# Patient Record
Sex: Male | Born: 2000 | Race: White | Hispanic: Yes | Marital: Single | Smoking: Never smoker
Health system: Southern US, Community
[De-identification: ages and names within clinical notes are randomized; demographics above are authoritative.]

## PROBLEM LIST (undated history)

## (undated) DIAGNOSIS — L309 Dermatitis, unspecified: Secondary | ICD-10-CM

## (undated) HISTORY — PX: ARTHROSCOPIC REPAIR ACL: SUR80

---
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
## 2020-02-10 ENCOUNTER — Ambulatory Visit: Payer: Medicaid Other | Attending: Family

## 2020-02-10 DIAGNOSIS — Z23 Encounter for immunization: Secondary | ICD-10-CM

## 2020-03-15 NOTE — Progress Notes (Signed)
   Covid-19 Vaccination Clinic  Name:  RAINEN VANROSSUM    MRN: [id number] DOB: Jan 13, 2001  03/15/2020  Mr. Sortor was observed post Covid-19 immunization for 15 minutes without incident. He was provided with Vaccine Information Sheet and instruction to access the V-Safe system.   Mr. Gunkel was instructed to call 911 with any severe reactions post vaccine: Marland Kitchen Difficulty breathing  . Swelling of face and throat  . A fast heartbeat  . A bad rash all over body  . Dizziness and weakness   Immunizations Administered    Name Date Dose VIS Date Route   Pfizer COVID-19 Vaccine 02/10/2020  4:00 PM 0.3 mL 02/12/2020 Intramuscular   Manufacturer: [employer], [employer]   Lot: [id number]   NDC: [address]-9

## 2020-04-22 ENCOUNTER — Other Ambulatory Visit: Payer: Self-pay | Admitting: Orthopedic Surgery

## 2020-04-28 ENCOUNTER — Other Ambulatory Visit: Payer: Self-pay

## 2020-04-28 ENCOUNTER — Encounter (HOSPITAL_BASED_OUTPATIENT_CLINIC_OR_DEPARTMENT_OTHER): Payer: Self-pay | Admitting: Orthopedic Surgery

## 2020-04-28 NOTE — Progress Notes (Signed)
Pt seen 09/11/19 by Dr Jacques Navy for c/o chest pain. EKG SB, ECHO WNL EF 55-60%, Stress test ordered but not completed. Reviewed with Dr Richardson Landry. Ok to proceed with shoulder surgery as planned without further work up or clearance.

## 2020-05-01 ENCOUNTER — Other Ambulatory Visit (HOSPITAL_COMMUNITY): Payer: Medicaid Other

## 2020-05-04 ENCOUNTER — Encounter (HOSPITAL_COMMUNITY): Payer: Self-pay | Admitting: Anesthesiology

## 2020-05-04 ENCOUNTER — Other Ambulatory Visit (HOSPITAL_COMMUNITY)
Admission: RE | Admit: 2020-05-04 | Discharge: 2020-05-04 | Disposition: A | Discharge status: Home / Self Care | Payer: Medicaid Other | Source: Ambulatory Visit | Attending: Orthopedic Surgery | Admitting: Orthopedic Surgery

## 2020-05-04 DIAGNOSIS — Z01812 Encounter for preprocedural laboratory examination: Secondary | ICD-10-CM | POA: Insufficient documentation

## 2020-05-04 DIAGNOSIS — Z20822 Contact with and (suspected) exposure to covid-19: Secondary | ICD-10-CM | POA: Diagnosis not present

## 2020-05-04 DIAGNOSIS — Z8616 Personal history of COVID-19: Secondary | ICD-10-CM

## 2020-05-04 HISTORY — DX: Personal history of COVID-19: Z86.16

## 2020-05-04 LAB — SARS CORONAVIRUS 2 (TAT 6-24 HRS): SARS Coronavirus 2: POSITIVE — AB

## 2020-05-04 NOTE — Progress Notes (Signed)
      Enhanced Recovery after Surgery for Orthopedics Enhanced Recovery after Surgery is a protocol used to improve the stress on your body and your recovery after surgery.  Patient Instructions  . The night before surgery:  o No food after midnight. ONLY clear liquids after midnight  . The day of surgery (if you do NOT have diabetes):  o Drink ONE (1) Pre-Surgery Clear Ensure as directed.   o This drink was given to you during your hospital  pre-op appointment visit. o The pre-op nurse will instruct you on the time to drink the  Pre-Surgery Ensure depending on your surgery time. o Finish the drink at the designated time by the pre-op nurse.  o Nothing else to drink after completing the  Pre-Surgery Clear Ensure.  . The day of surgery (if you have diabetes): o Drink ONE (1) Gatorade 2 (G2) as directed. o This drink was given to you during your hospital  pre-op appointment visit.  o The pre-op nurse will instruct you on the time to drink the   Gatorade 2 (G2) depending on your surgery time. o Color of the Gatorade may vary. Red is not allowed. o Nothing else to drink after completing the  Gatorade 2 (G2).         If you have questions, please contact your surgeon's office.  Given surgical soap and instructions. Verbalized understanding. 

## 2020-05-04 NOTE — Progress Notes (Signed)
Text message sent to patient to remind him to go for covid testing before 3pm today. Address information for testing site and [facility] phone # provided.

## 2020-05-05 ENCOUNTER — Ambulatory Visit (HOSPITAL_BASED_OUTPATIENT_CLINIC_OR_DEPARTMENT_OTHER): Admission: RE | Admit: 2020-05-05 | Payer: Medicaid Other | Source: Home / Self Care | Admitting: Orthopedic Surgery

## 2020-05-05 SURGERY — ARTHROSCOPY, SHOULDER
Anesthesia: Choice | Site: Shoulder | Laterality: Left

## 2020-05-05 NOTE — Progress Notes (Signed)
Pt COVID + result. Dr. Ave Filter made aware and will notify patient of results and that surgery would need to be postponed.

## 2020-05-07 ENCOUNTER — Other Ambulatory Visit: Payer: Medicaid Other

## 2020-05-14 ENCOUNTER — Other Ambulatory Visit: Payer: Self-pay | Admitting: Orthopedic Surgery

## 2020-05-21 ENCOUNTER — Other Ambulatory Visit: Payer: Self-pay

## 2020-05-21 ENCOUNTER — Encounter (HOSPITAL_BASED_OUTPATIENT_CLINIC_OR_DEPARTMENT_OTHER): Payer: Self-pay | Admitting: Orthopedic Surgery

## 2020-05-22 ENCOUNTER — Other Ambulatory Visit (HOSPITAL_COMMUNITY): Payer: Medicaid Other

## 2020-05-25 NOTE — Anesthesia Preprocedure Evaluation (Signed)
Anesthesia Evaluation  Patient identified by MRN, date of birth, ID band Patient awake    Reviewed: Allergy & Precautions, H&P , NPO status , Patient's Chart, lab work & pertinent test results  Airway Mallampati: II  TM Distance: >3 FB Neck ROM: Full    Dental no notable dental hx. (+) Teeth Intact, Dental Advisory Given   Pulmonary neg pulmonary ROS,    Pulmonary exam normal breath sounds clear to auscultation       Cardiovascular Exercise Tolerance: Good negative cardio ROS Normal cardiovascular exam Rhythm:Regular Rate:Normal  ECHO 21' EF 55% NL   Neuro/Psych negative neurological ROS  negative psych ROS   GI/Hepatic negative GI ROS, Neg liver ROS,   Endo/Other  negative endocrine ROSMorbid obesity  Renal/GU negative Renal ROS  negative genitourinary   Musculoskeletal negative musculoskeletal ROS (+)   Abdominal   Peds negative pediatric ROS (+)  Hematology negative hematology ROS (+)   Anesthesia Other Findings   Reproductive/Obstetrics negative OB ROS                           Anesthesia Physical Anesthesia Plan  ASA: II  Anesthesia Plan: General   Post-op Pain Management: GA combined w/ Regional for post-op pain   Induction:   PONV Risk Score and Plan: 2 and Ondansetron, Dexamethasone and Midazolam  Airway Management Planned: Oral ETT and LMA  Additional Equipment:   Intra-op Plan:   Post-operative Plan: Extubation in OR  Informed Consent: I have reviewed the patients History and Physical, chart, labs and discussed the procedure including the risks, benefits and alternatives for the proposed anesthesia with the patient or authorized representative who has indicated his/her understanding and acceptance.     Dental advisory given  Plan Discussed with: Anesthesiologist and CRNA  Anesthesia Plan Comments: (Discussed both nerve block for pain relief post-op and GA;  including NV, sore throat, dental injury, and pulmonary complications)       Anesthesia Quick Evaluation

## 2020-05-26 ENCOUNTER — Encounter (HOSPITAL_BASED_OUTPATIENT_CLINIC_OR_DEPARTMENT_OTHER)
Admission: RE | Disposition: A | Discharge status: Home / Self Care | Payer: Self-pay | Source: Home / Self Care | Attending: Orthopedic Surgery

## 2020-05-26 ENCOUNTER — Ambulatory Visit (HOSPITAL_BASED_OUTPATIENT_CLINIC_OR_DEPARTMENT_OTHER)
Admission: RE | Admit: 2020-05-26 | Discharge: 2020-05-26 | Disposition: A | Discharge status: Home / Self Care | Payer: Medicaid Other | Attending: Orthopedic Surgery | Admitting: Orthopedic Surgery

## 2020-05-26 ENCOUNTER — Ambulatory Visit (HOSPITAL_BASED_OUTPATIENT_CLINIC_OR_DEPARTMENT_OTHER): Payer: Medicaid Other | Admitting: Anesthesiology

## 2020-05-26 ENCOUNTER — Other Ambulatory Visit: Payer: Self-pay

## 2020-05-26 ENCOUNTER — Encounter (HOSPITAL_BASED_OUTPATIENT_CLINIC_OR_DEPARTMENT_OTHER): Payer: Self-pay | Admitting: Orthopedic Surgery

## 2020-05-26 DIAGNOSIS — X58XXXA Exposure to other specified factors, initial encounter: Secondary | ICD-10-CM | POA: Diagnosis not present

## 2020-05-26 DIAGNOSIS — Z8616 Personal history of COVID-19: Secondary | ICD-10-CM | POA: Insufficient documentation

## 2020-05-26 DIAGNOSIS — S43432A Superior glenoid labrum lesion of left shoulder, initial encounter: Secondary | ICD-10-CM | POA: Insufficient documentation

## 2020-05-26 HISTORY — PX: SHOULDER ARTHROSCOPY WITH LABRAL REPAIR: SHX5691

## 2020-05-26 SURGERY — ARTHROSCOPY, SHOULDER, WITH GLENOID LABRUM REPAIR
Anesthesia: General | Site: Shoulder | Laterality: Left

## 2020-05-26 MED ORDER — ROCURONIUM BROMIDE 10 MG/ML (PF) SYRINGE
PREFILLED_SYRINGE | INTRAVENOUS | Status: AC
  Filled 2020-05-26: qty 10

## 2020-05-26 MED ORDER — ACETAMINOPHEN 325 MG PO TABS: 325.0000 mg | ORAL_TABLET | ORAL | Status: DC | PRN

## 2020-05-26 MED ORDER — MIDAZOLAM HCL 2 MG/2ML IJ SOLN
2.0000 mg | Freq: Once | INTRAMUSCULAR | Status: AC
  Administered 2020-05-26: 2 mg via INTRAVENOUS

## 2020-05-26 MED ORDER — TIZANIDINE HCL 4 MG PO TABS: 4.0000 mg | ORAL_TABLET | Freq: Three times a day (TID) | ORAL | 1 refills | Status: AC | PRN

## 2020-05-26 MED ORDER — FENTANYL CITRATE (PF) 100 MCG/2ML IJ SOLN
100.0000 ug | Freq: Once | INTRAMUSCULAR | Status: AC
  Administered 2020-05-26: 100 ug via INTRAVENOUS

## 2020-05-26 MED ORDER — MEPERIDINE HCL 25 MG/ML IJ SOLN
6.2500 mg | INTRAMUSCULAR | Status: DC | PRN
Start: 2020-05-26 — End: 2020-05-26

## 2020-05-26 MED ORDER — HYDROCODONE-ACETAMINOPHEN 5-325 MG PO TABS: 1.0000 | ORAL_TABLET | ORAL | 0 refills | Status: AC | PRN

## 2020-05-26 MED ORDER — SUGAMMADEX SODIUM 500 MG/5ML IV SOLN
INTRAVENOUS | Status: AC
  Filled 2020-05-26: qty 5

## 2020-05-26 MED ORDER — ONDANSETRON HCL 4 MG/2ML IJ SOLN: 4.0000 mg | Freq: Once | INTRAMUSCULAR | Status: DC | PRN

## 2020-05-26 MED ORDER — CEFAZOLIN SODIUM-DEXTROSE 2-4 GM/100ML-% IV SOLN
INTRAVENOUS | Status: AC
  Filled 2020-05-26: qty 100

## 2020-05-26 MED ORDER — MIDAZOLAM HCL 2 MG/2ML IJ SOLN
INTRAMUSCULAR | Status: AC
  Filled 2020-05-26: qty 2

## 2020-05-26 MED ORDER — DEXMEDETOMIDINE (PRECEDEX) IN NS 20 MCG/5ML (4 MCG/ML) IV SYRINGE
PREFILLED_SYRINGE | INTRAVENOUS | Status: AC
  Filled 2020-05-26: qty 5

## 2020-05-26 MED ORDER — LACTATED RINGERS IV SOLN: INTRAVENOUS | Status: DC

## 2020-05-26 MED ORDER — SODIUM CHLORIDE 0.9 % IR SOLN
Status: DC | PRN
  Administered 2020-05-26: 3000 mL

## 2020-05-26 MED ORDER — ROCURONIUM BROMIDE 100 MG/10ML IV SOLN
INTRAVENOUS | Status: DC | PRN
  Administered 2020-05-26: 70 mg via INTRAVENOUS

## 2020-05-26 MED ORDER — FENTANYL CITRATE (PF) 100 MCG/2ML IJ SOLN
INTRAMUSCULAR | Status: DC | PRN
  Administered 2020-05-26 (×2): 50 ug via INTRAVENOUS

## 2020-05-26 MED ORDER — EPINEPHRINE PF 1 MG/ML IJ SOLN
INTRAMUSCULAR | Status: AC
  Filled 2020-05-26: qty 4

## 2020-05-26 MED ORDER — BUPIVACAINE LIPOSOME 1.3 % IJ SUSP
INTRAMUSCULAR | Status: DC | PRN
Start: 2020-05-26 — End: 2020-05-26
  Administered 2020-05-26: 10 mL via PERINEURAL

## 2020-05-26 MED ORDER — DEXAMETHASONE SODIUM PHOSPHATE 10 MG/ML IJ SOLN
INTRAMUSCULAR | Status: DC | PRN
  Administered 2020-05-26: 5 mg via INTRAVENOUS

## 2020-05-26 MED ORDER — FENTANYL CITRATE (PF) 100 MCG/2ML IJ SOLN: 25.0000 ug | INTRAMUSCULAR | Status: DC | PRN

## 2020-05-26 MED ORDER — SUGAMMADEX SODIUM 500 MG/5ML IV SOLN
INTRAVENOUS | Status: DC | PRN
  Administered 2020-05-26: 450 mg via INTRAVENOUS

## 2020-05-26 MED ORDER — LIDOCAINE 2% (20 MG/ML) 5 ML SYRINGE
INTRAMUSCULAR | Status: AC
  Filled 2020-05-26: qty 5

## 2020-05-26 MED ORDER — DEXMEDETOMIDINE (PRECEDEX) IN NS 20 MCG/5ML (4 MCG/ML) IV SYRINGE
PREFILLED_SYRINGE | INTRAVENOUS | Status: DC | PRN
  Administered 2020-05-26 (×4): 4 ug via INTRAVENOUS

## 2020-05-26 MED ORDER — BUPIVACAINE HCL 0.5 % IJ SOLN
INTRAMUSCULAR | Status: DC | PRN
  Administered 2020-05-26: 15 mL

## 2020-05-26 MED ORDER — FENTANYL CITRATE (PF) 100 MCG/2ML IJ SOLN
INTRAMUSCULAR | Status: AC
  Filled 2020-05-26: qty 2

## 2020-05-26 MED ORDER — OXYCODONE HCL 5 MG PO TABS
5.0000 mg | ORAL_TABLET | Freq: Once | ORAL | Status: DC | PRN
Start: 2020-05-26 — End: 2020-05-26

## 2020-05-26 MED ORDER — ACETAMINOPHEN 160 MG/5ML PO SOLN: 325.0000 mg | ORAL | Status: DC | PRN

## 2020-05-26 MED ORDER — CEFAZOLIN SODIUM-DEXTROSE 2-4 GM/100ML-% IV SOLN
2.0000 g | INTRAVENOUS | Status: AC
  Administered 2020-05-26: 2 g via INTRAVENOUS

## 2020-05-26 MED ORDER — ONDANSETRON HCL 4 MG/2ML IJ SOLN
INTRAMUSCULAR | Status: DC | PRN
  Administered 2020-05-26: 4 mg via INTRAVENOUS

## 2020-05-26 MED ORDER — DEXAMETHASONE SODIUM PHOSPHATE 10 MG/ML IJ SOLN
INTRAMUSCULAR | Status: AC
  Filled 2020-05-26: qty 1

## 2020-05-26 MED ORDER — PROPOFOL 10 MG/ML IV BOLUS
INTRAVENOUS | Status: DC | PRN
  Administered 2020-05-26: 200 mg via INTRAVENOUS

## 2020-05-26 MED ORDER — OXYCODONE HCL 5 MG/5ML PO SOLN: 5.0000 mg | Freq: Once | ORAL | Status: DC | PRN

## 2020-05-26 MED ORDER — PROPOFOL 10 MG/ML IV BOLUS
INTRAVENOUS | Status: AC
  Filled 2020-05-26: qty 40

## 2020-05-26 MED ORDER — ONDANSETRON HCL 4 MG/2ML IJ SOLN
INTRAMUSCULAR | Status: AC
  Filled 2020-05-26: qty 2

## 2020-05-26 SURGICAL SUPPLY — 59 items
ANCH SUT SHRT 12.5 CANN EYLT (Anchor) ×2 IMPLANT
ANCHOR SUT BIOCOMP LK 2.9X12.5 (Anchor) ×4 IMPLANT
APL PRP STRL LF DISP 70% ISPRP (MISCELLANEOUS) ×1
APL SKNCLS STERI-STRIP NONHPOA (GAUZE/BANDAGES/DRESSINGS)
BENZOIN TINCTURE PRP APPL 2/3 (GAUZE/BANDAGES/DRESSINGS) IMPLANT
BLADE CLIPPER SURG (BLADE) IMPLANT
BURR OVAL 8 FLU 4.0X13 (MISCELLANEOUS) IMPLANT
CANNULA 5.75X7 CRYSTAL CLEAR (CANNULA) ×2 IMPLANT
CANNULA TWIST IN 8.25X7CM (CANNULA) IMPLANT
CHLORAPREP W/TINT 26 (MISCELLANEOUS) ×2 IMPLANT
COVER WAND RF STERILE (DRAPES) IMPLANT
CUTTER BONE 4.0MM X 13CM (MISCELLANEOUS) ×2 IMPLANT
DECANTER SPIKE VIAL GLASS SM (MISCELLANEOUS) IMPLANT
DRAPE IMP U-DRAPE 54X76 (DRAPES) ×2 IMPLANT
DRAPE INCISE IOBAN 66X45 STRL (DRAPES) ×2 IMPLANT
DRAPE STERI 35X30 U-POUCH (DRAPES) ×2 IMPLANT
DRAPE SURG 17X23 STRL (DRAPES) ×2 IMPLANT
DRAPE U-SHAPE 47X51 STRL (DRAPES) ×2 IMPLANT
DRAPE U-SHAPE 76X120 STRL (DRAPES) ×4 IMPLANT
DRSG PAD ABDOMINAL 8X10 ST (GAUZE/BANDAGES/DRESSINGS) ×2 IMPLANT
FIBERSTICK 2 (SUTURE) IMPLANT
GAUZE 4X4 16PLY RFD (DISPOSABLE) IMPLANT
GAUZE SPONGE 4X4 12PLY STRL (GAUZE/BANDAGES/DRESSINGS) ×2 IMPLANT
GAUZE XEROFORM 1X8 LF (GAUZE/BANDAGES/DRESSINGS) ×2 IMPLANT
GLOVE BIO SURGEON STRL SZ7.5 (GLOVE) ×2 IMPLANT
GLOVE ECLIPSE 6.5 STRL STRAW (GLOVE) ×2 IMPLANT
GLOVE SRG 8 PF TXTR STRL LF DI (GLOVE) ×1 IMPLANT
GLOVE SURG ENC MOIS LTX SZ7 (GLOVE) ×2 IMPLANT
GLOVE SURG UNDER POLY LF SZ7 (GLOVE) ×4 IMPLANT
GLOVE SURG UNDER POLY LF SZ8 (GLOVE) ×2
GOWN STRL REUS W/ TWL LRG LVL3 (GOWN DISPOSABLE) ×2 IMPLANT
GOWN STRL REUS W/TWL LRG LVL3 (GOWN DISPOSABLE) ×6 IMPLANT
KIT PERC INSERT 3.0 KNTLS (KITS) ×2 IMPLANT
KIT PUSHLOCK 2.9 HIP (KITS) ×2 IMPLANT
LASSO 90 CVE QUICKPAS (DISPOSABLE) ×2 IMPLANT
LASSO CRESCENT QUICKPASS (SUTURE) IMPLANT
MANIFOLD NEPTUNE II (INSTRUMENTS) ×2 IMPLANT
NS IRRIG 1000ML POUR BTL (IV SOLUTION) IMPLANT
PACK ARTHROSCOPY DSU (CUSTOM PROCEDURE TRAY) ×2 IMPLANT
PACK BASIN DAY SURGERY FS (CUSTOM PROCEDURE TRAY) ×2 IMPLANT
PROBE BIPOLAR ATHRO 135MM 90D (MISCELLANEOUS) ×2 IMPLANT
SHEET MEDIUM DRAPE 40X70 STRL (DRAPES) IMPLANT
SLEEVE SCD COMPRESS KNEE MED (MISCELLANEOUS) ×2 IMPLANT
SLING ARM FOAM STRAP LRG (SOFTGOODS) IMPLANT
SLING ARM FOAM STRAP XLG (SOFTGOODS) ×2 IMPLANT
SUPPORT WRAP ARM LG (MISCELLANEOUS) ×2 IMPLANT
SUT ETHILON 4 0 PS 2 18 (SUTURE) ×2 IMPLANT
SUT FIBERWIRE #2 38 T-5 BLUE (SUTURE)
SUT FIBERWIRE 2-0 18 17.9 3/8 (SUTURE)
SUT PDS AB 0 CT 36 (SUTURE) IMPLANT
SUT TIGER TAPE 7 IN WHITE (SUTURE) IMPLANT
SUTURE FIBERWR #2 38 T-5 BLUE (SUTURE) IMPLANT
SUTURE FIBERWR 2-0 18 17.9 3/8 (SUTURE) IMPLANT
TAPE FIBER 2MM 7IN #2 BLUE (SUTURE) IMPLANT
TAPE LABRALWHITE 1.5X36 (TAPE) ×2 IMPLANT
TAPE SUT LABRALTAP WHT/BLK (SUTURE) ×2 IMPLANT
TOWEL GREEN STERILE FF (TOWEL DISPOSABLE) ×2 IMPLANT
TUBE CONNECTING 20X1/4 (TUBING) ×2 IMPLANT
TUBING ARTHROSCOPY IRRIG 16FT (MISCELLANEOUS) ×2 IMPLANT

## 2020-05-26 NOTE — Anesthesia Procedure Notes (Signed)
Anesthesia Regional Block: Interscalene brachial plexus block   Pre-Anesthetic Checklist: ,, timeout performed, Correct Patient, Correct Site, Correct Laterality, Correct Procedure, Correct Position, site marked, Risks and benefits discussed,  Surgical consent,  Pre-op evaluation,  At surgeon's request and post-op pain management  Laterality: Left  Prep: chloraprep       Needles:  Injection technique: Single-shot  Needle Type: Echogenic Stimulator Needle     Needle Length: 5cm  Needle Gauge: 22     Additional Needles:   Procedures:, nerve stimulator,,, ultrasound used (permanent image in chart),,,,   Nerve Stimulator or Paresthesia:  Response: hand, 0.45 mA,   Additional Responses:   Narrative:  Start time: 05/26/2020 7:15 AM End time: 05/26/2020 7:20 AM Injection made incrementally with aspirations every 5 mL.  Performed by: Personally  Anesthesiologist: Bethena Midget, MD  Additional Notes: Functioning IV was confirmed and monitors were applied.  A 66mm 22ga Arrow echogenic stimulator needle was used. Sterile prep and drape,hand hygiene and sterile gloves were used. Ultrasound guidance: relevant anatomy identified, needle position confirmed, local anesthetic spread visualized around nerve(s)., vascular puncture avoided.  Image printed for medical record. Negative aspiration and negative test dose prior to incremental administration of local anesthetic. The patient tolerated the procedure well.

## 2020-05-26 NOTE — Anesthesia Postprocedure Evaluation (Signed)
Anesthesia Post Note  Patient: Cody Wright  Procedure(s) Performed: ARTHROSCOPY SHOULDER SUPERIOR LABRUM ANTERIOR TO POSTERIOR REPAIR (Left Shoulder)     Patient location during evaluation: PACU Anesthesia Type: General Level of consciousness: awake and alert Pain management: pain level controlled Vital Signs Assessment: post-procedure vital signs reviewed and stable Respiratory status: spontaneous breathing, nonlabored ventilation, respiratory function stable and patient connected to nasal cannula oxygen Cardiovascular status: blood pressure returned to baseline and stable Postop Assessment: no apparent nausea or vomiting Anesthetic complications: no   No complications documented.  Last Vitals:  Vitals:   05/26/20 0911 05/26/20 0946  BP:  132/63  Pulse: 75 72  Resp: 19 20  Temp:  37 C  SpO2: 95% 96%    Last Pain:  Vitals:   05/26/20 0946  TempSrc: Oral  PainSc: 0-No pain                 Wilmore Holsomback

## 2020-05-26 NOTE — Transfer of Care (Signed)
Immediate Anesthesia Transfer of Care Note  Patient: Cody Wright  Procedure(s) Performed: ARTHROSCOPY SHOULDER SUPERIOR LABRUM ANTERIOR TO POSTERIOR REPAIR (Left Shoulder)  Patient Location: PACU  Anesthesia Type:General and Regional  Level of Consciousness: drowsy  Airway & Oxygen Therapy: Patient Spontanous Breathing and Patient connected to face mask oxygen  Post-op Assessment: Report given to RN and Post -op Vital signs reviewed and stable  Post vital signs: Reviewed and stable  Last Vitals:  Vitals Value Taken Time  BP 131/69 05/26/20 0840  Temp    Pulse 69 05/26/20 0842  Resp 15 05/26/20 0842  SpO2 99 % 05/26/20 0842  Vitals shown include unvalidated device data.  Last Pain:  Vitals:   05/26/20 0648  TempSrc: Oral  PainSc: 4       Patients Stated Pain Goal: 3 (05/26/20 3846)  Complications: No complications documented.

## 2020-05-26 NOTE — H&P (Signed)
Cody Wright is an 20 y.o. male.   Chief Complaint: L shoulder pain  HPI: L shoulder SLAP tear , failed conservative treatment.  Past Medical History:  Diagnosis Date  . Asthma    no problems since age 87  . Eczema   . History of COVID-19 05/04/2020    Past Surgical History:  Procedure Laterality Date  . ARTHROSCOPIC REPAIR ACL Right     History reviewed. No pertinent family history. Social History:  reports that he has never smoked. He has never used smokeless tobacco. He reports that he does not drink alcohol and does not use drugs.  Allergies:  Allergies  Allergen Reactions  . Mineral Oil Rash    Medications Prior to Admission  Medication Sig Dispense Refill  . ibuprofen (ADVIL,MOTRIN) 200 MG tablet Take 200 mg by mouth once.      No results found for this or any previous visit (from the past 48 hour(s)). No results found.  Review of Systems  All other systems reviewed and are negative.   Blood pressure 131/71, pulse 68, temperature 98.3 F (36.8 C), temperature source Oral, resp. rate 20, height 5\' 10"  (1.778 m), weight 115.4 kg, SpO2 99 %. Physical Exam Constitutional:      Appearance: He is well-developed and well-nourished.  HENT:     Head: Atraumatic.  Eyes:     Extraocular Movements: EOM normal.  Cardiovascular:     Pulses: Intact distal pulses.  Pulmonary:     Effort: Pulmonary effort is normal.  Musculoskeletal:     Comments: Left shoulder pain with obriens testing.  Skin:    General: Skin is warm and dry.  Neurological:     Mental Status: He is alert and oriented to person, place, and time.  Psychiatric:        Mood and Affect: Mood and affect normal.      Assessment/Plan  L shoulder SLAP tear , failed conservative treatment. Plan arth SLAP repair Risks / benefits of surgery discussed Consent on chart  NPO for OR Preop antibiotics  , MD 05/26/2020, 7:10 AM

## 2020-05-26 NOTE — Anesthesia Procedure Notes (Signed)
Procedure Name: Intubation Date/Time: 05/26/2020 7:35 AM Performed by: Marny Lowenstein, CRNA Pre-anesthesia Checklist: Patient identified, Emergency Drugs available, Suction available and Patient being monitored Patient Re-evaluated:Patient Re-evaluated prior to induction Oxygen Delivery Method: Circle system utilized Preoxygenation: Pre-oxygenation with 100% oxygen Induction Type: IV induction Ventilation: Mask ventilation without difficulty Laryngoscope Size: Miller and 2 Grade View: Grade I Tube type: Oral Tube size: 7.5 mm Number of attempts: 1 Airway Equipment and Method: Patient positioned with wedge pillow Placement Confirmation: ETT inserted through vocal cords under direct vision,  positive ETCO2 and breath sounds checked- equal and bilateral Secured at: 23 cm Tube secured with: Tape Dental Injury: Teeth and Oropharynx as per pre-operative assessment

## 2020-05-26 NOTE — Op Note (Signed)
Procedure(s): ARTHROSCOPY SHOULDER SUPERIOR LABRUM ANTERIOR TO POSTERIOR REPAIR Procedure Note  Cody Wright male 20 y.o. 05/26/2020   Preop diagnosis: Left shoulder SLAP tear Postoperative diagnosis: Same  Procedure(s) and Anesthesia Type:    * ARTHROSCOPY SHOULDER SUPERIOR LABRUM ANTERIOR TO POSTERIOR REPAIR - General  Surgeon(s) and Role:    Jones Broom, MD - Primary     Surgeon: [name]   Assistants: [name] PA-C [facility] was present and scrubbed throughout the procedure and was essential in positioning, assisting with the camera and instrumentation,, and closure)  Anesthesia: General LMA anesthesia with preoperative interscalene block given by the attending anesthesiologist    Procedure Detail  ARTHROSCOPY SHOULDER SUPERIOR LABRUM ANTERIOR TO POSTERIOR REPAIR  Estimated Blood Loss: Min         Drains: none  Blood Given: none         Specimens: none        Complications:  * No complications entered in OR log *         Disposition: PACU - hemodynamically stable.         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  He has had left shoulder pain which has been refractory to nonoperative management.  MRI revealed superior anterior labral tear, ultimately indicated for surgery to try and decrease pain and restore function.  OPERATIVE FINDINGS: Examination under anesthesia: No stiffness or instability  DESCRIPTION OF PROCEDURE: The patient was identified in preoperative  holding area where I personally marked the operative site after  verifying site, side, and procedure with the patient. An interscalene block was given by the attending anesthesiologist the holding area.  The patient was taken back to the operating room where general anesthesia was induced without complication and was placed in the beach-chair position with the back  elevated about 60  degrees and all extremities and head and neck carefully padded and  positioned.   The left upper extremity was then prepped and  draped in a standard sterile fashion. The appropriate time-out  procedure was carried out. The patient did receive IV antibiotics  within 30 minutes of incision.   A small posterior portal incision was made and the arthroscope was introduced into the joint. An anterior portal was then established above the subscapularis using needle localization. Small cannula was placed anteriorly. Diagnostic arthroscopy was then carried out. The subscapularis was intact.  The biceps tendon was healthy-appearing.  There was a little bit of fraying around the biceps pulley but overall the pulley was intact.  Glenohumeral joint surfaces were intact without chondromalacia.  The undersurface of the supraspinatus and infraspinatus were intact without any partial tearing.  The superior labrum was noted to have extensive tearing with complete detachment of the biceps anchor.  This extended down slightly into the anterior superior labrum with flap tears but no significant detachment and the anterior inferior labrum was completely intact.  The posterior labrum was also intact.  The shaver was used to debride the undersurface of the tear and the superior aspect of the glenoid attachment site was debrided down to a bleeding bony surface to promote healing.  A large cannula was used anteriorly and a percutaneous small cannula was placed through the musculotendinous junction of the supraspinatus through a lateral portal of [place].  Labral tapes were then passed around the superior labrum just posterior to the biceps origin and a second about  8 to 10 mm behind the first.  These were placed into 2.9 push lock anchors in the superior labrum completely repairing the torn aspect of the superior labrum.  The anterior labral flap tears were debrided back to healthy labrum.  The arthroscopic equipment was  removed from the joint and the portals were closed with 3-0 nylon in an interrupted fashion. Sterile dressings were then applied including Xeroform 4 x 4's ABDs and tape. The patient was then allowed to awaken from general anesthesia, placed in a sling, transferred to the stretcher and taken to the recovery room in stable condition.   POSTOPERATIVE PLAN: The patient will be discharged home today and will followup in one week for suture removal and wound check.

## 2020-05-26 NOTE — Discharge Instructions (Signed)
Post Anesthesia Home Care Instructions  Activity: Get plenty of rest for the remainder of the day. A responsible individual must stay with you for 24 hours following the procedure.  For the next 24 hours, DO NOT: -Drive a car -Paediatric nurse -Drink alcoholic beverages -Take any medication unless instructed by your physician -Make any legal decisions or sign important papers.  Meals: Start with liquid foods such as gelatin or soup. Progress to regular foods as tolerated. Avoid greasy, spicy, heavy foods. If nausea and/or vomiting occur, drink only clear liquids until the nausea and/or vomiting subsides. Call your physician if vomiting continues.  Special Instructions/Symptoms: Your throat may feel dry or sore from the anesthesia or the breathing tube placed in your throat during surgery. If this causes discomfort, gargle with warm salt water. The discomfort should disappear within 24 hours.  If you had a scopolamine patch placed behind your ear for the management of post- operative nausea and/or vomiting:  1. The medication in the patch is effective for 72 hours, after which it should be removed.  Wrap patch in a tissue and discard in the trash. Wash hands thoroughly with soap and water. 2. You may remove the patch earlier than 72 hours if you experience unpleasant side effects which may include dry mouth, dizziness or visual disturbances. 3. Avoid touching the patch. Wash your hands with soap and water after contact with the patch.       Regional Anesthesia Blocks  1. Numbness or the inability to move the "blocked" extremity may last from 3-48 hours after placement. The length of time depends on the medication injected and your individual response to the medication. If the numbness is not going away after 48 hours, call your surgeon.  2. The extremity that is blocked will need to be protected until the numbness is gone and the  Strength has returned. Because you cannot feel it, you  will need to take extra care to avoid injury. Because it may be weak, you may have difficulty moving it or using it. You may not know what position it is in without looking at it while the block is in effect.  3. For blocks in the legs and feet, returning to weight bearing and walking needs to be done carefully. You will need to wait until the numbness is entirely gone and the strength has returned. You should be able to move your leg and foot normally before you try and bear weight or walk. You will need someone to be with you when you first try to ensure you do not fall and possibly risk injury.  4. Bruising and tenderness at the needle site are common side effects and will resolve in a few days.  5. Persistent numbness or new problems with movement should be communicated to the surgeon or the [place] [phone] [place] [phone]).   Information for Discharge Teaching: EXPAREL (bupivacaine liposome injectable suspension)   Your surgeon or anesthesiologist gave you EXPAREL(bupivacaine) to help control your pain after surgery.   EXPAREL is a local anesthetic that provides pain relief by numbing the tissue around the surgical site.  EXPAREL is designed to release pain medication over time and can control pain for up to 72 hours.  Depending on how you respond to EXPAREL, you may require less pain medication during your recovery.  Possible side effects:  Temporary loss of sensation or ability to move in the area where bupivacaine was injected.  Nausea, vomiting, constipation  Rarely, numbness and tingling in your mouth or lips, lightheadedness, or anxiety may occur.  Call your doctor right away if you think you may be experiencing any of these sensations, or if you have other questions regarding possible side effects.  Follow all other discharge instructions given to you by your surgeon or nurse. Eat a healthy diet and drink plenty of water or  other fluids.  If you return to the hospital for any reason within 96 hours following the administration of EXPAREL, it is important for health care providers to know that you have received this anesthetic. A teal colored band has been placed on your arm with the date, time and amount of EXPAREL you have received in order to alert and inform your health care providers. Please leave this armband in place for the full 96 hours following administration, and then you may remove the band.    Discharge Instructions after Arthroscopic Shoulder Repair   A sling has been provided for you. Remain in your sling at all times. This includes sleeping in your sling.  Use ice on the shoulder intermittently over the first 48 hours after surgery.  Pain medicine has been prescribed for you.  Use your medicine liberally over the first 48 hours, and then you can begin to taper your use. You may take Extra Strength Tylenol or Tylenol only in place of the pain pills. DO NOT take ANY nonsteroidal anti-inflammatory pain medications: Advil, Motrin, Ibuprofen, Aleve, Naproxen, or Narprosyn.  You may remove your dressing after two days. If the incision sites are still moist, place a Band-Aid over the moist site(s). Change Band-Aids daily until dry.  You may shower 5 days after surgery. The incisions CANNOT get wet prior to 5 days. Simply allow the water to wash over the site and then pat dry. Do not rub the incisions. Make sure your axilla (armpit) is completely dry after showering.  Take one aspirin a day for 2 weeks after surgery, unless you have an aspirin sensitivity/ allergy or asthma.   Please call [phone] during normal business hours or [phone] after hours for any problems. Including the following:  - excessive redness of the incisions - drainage for more than 4 days - fever of more than 101.5 F  *Please note that pain medications will not be refilled after hours or on weekends.

## 2020-05-26 NOTE — Progress Notes (Signed)
Assisted Dr. Oddono with left, ultrasound guided, interscalene  block. Side rails up, monitors on throughout procedure. See vital signs in flow sheet. Tolerated Procedure well. 

## 2020-05-27 ENCOUNTER — Encounter (HOSPITAL_BASED_OUTPATIENT_CLINIC_OR_DEPARTMENT_OTHER): Payer: Self-pay | Admitting: Orthopedic Surgery

## 2022-07-25 ENCOUNTER — Other Ambulatory Visit: Payer: Self-pay

## 2022-07-25 ENCOUNTER — Emergency Department (HOSPITAL_BASED_OUTPATIENT_CLINIC_OR_DEPARTMENT_OTHER): Payer: Medicaid Other

## 2022-07-25 ENCOUNTER — Encounter (HOSPITAL_BASED_OUTPATIENT_CLINIC_OR_DEPARTMENT_OTHER): Payer: Self-pay

## 2022-07-25 ENCOUNTER — Emergency Department (HOSPITAL_BASED_OUTPATIENT_CLINIC_OR_DEPARTMENT_OTHER)
Admission: EM | Admit: 2022-07-25 | Discharge: 2022-07-25 | Disposition: A | Discharge status: Home / Self Care | Payer: Medicaid Other | Attending: Emergency Medicine | Admitting: Emergency Medicine

## 2022-07-25 DIAGNOSIS — S022XXA Fracture of nasal bones, initial encounter for closed fracture: Secondary | ICD-10-CM | POA: Diagnosis not present

## 2022-07-25 DIAGNOSIS — W2103XA Struck by baseball, initial encounter: Secondary | ICD-10-CM | POA: Insufficient documentation

## 2022-07-25 DIAGNOSIS — Y9364 Activity, baseball: Secondary | ICD-10-CM | POA: Diagnosis not present

## 2022-07-25 DIAGNOSIS — R040 Epistaxis: Secondary | ICD-10-CM | POA: Diagnosis present

## 2022-07-25 MED ORDER — KETOROLAC TROMETHAMINE 30 MG/ML IJ SOLN
30.0000 mg | Freq: Once | INTRAMUSCULAR | Status: AC
  Administered 2022-07-25: 30 mg via INTRAMUSCULAR
  Filled 2022-07-25: qty 1

## 2022-07-25 NOTE — ED Provider Notes (Signed)
[facility] Provider Note   CSN: [id number] Arrival date & time: 07/25/22  1828     History  Chief Complaint  Patient presents with   Facial Injury    Cody Wright is a 22 y.o. male.  Who presents to the ED for evaluation of facial trauma.  He was playing baseball when he dove for a ground ball.  The ball bounced, hit his glove and then hit him in the nose.  He did not lose consciousness.  Did not vomit after the incident.  Does not take blood thinners.  Reported that he had epistaxis immediately afterwards but lasted only few seconds and then resolved on its own.  Reports a mild headache and TTP of the nose.  Denies vision changes, numbness, weakness, tingling, neck pain.  Denies seizure-like activity or vomiting   Facial Injury      Home Medications Prior to Admission medications   Medication Sig Start Date End Date Taking? Authorizing Provider  tiZANidine (ZANAFLEX) 4 MG tablet Take 1 tablet (4 mg total) by mouth every 8 (eight) hours as needed for muscle spasms. 05/26/20   Grier Mitts, PA-C      Allergies    Mineral oil    Review of Systems   Review of Systems  Musculoskeletal:  Positive for joint swelling and myalgias.  All other systems reviewed and are negative.   Physical Exam Updated Vital Signs BP 136/74 (BP Location: Right Arm)   Pulse 77   Temp 99 F (37.2 C) (Oral)   Resp 18   Ht 5\' 10"  (1.778 m)   Wt 90.7 kg   SpO2 100%   BMI 28.70 kg/m  Physical Exam Vitals and nursing note reviewed.  Constitutional:      General: He is not in acute distress.    Appearance: Normal appearance. He is normal weight. He is not ill-appearing.  HENT:     Head: Normocephalic and atraumatic.     Comments: No raccoon eyes or Battle sign    Right Ear: Tympanic membrane normal.     Left Ear: Tympanic membrane normal.     Ears:     Comments: No hemotympanums    Nose:     Comments: Swelling to the bridge of the  nose.  No active bleeding.  Septum midline.    Mouth/Throat:     Mouth: Mucous membranes are moist.     Pharynx: No oropharyngeal exudate or posterior oropharyngeal erythema.     Comments: No bleeding Eyes:     Extraocular Movements: Extraocular movements intact.     Pupils: Pupils are equal, round, and reactive to light.  Pulmonary:     Effort: Pulmonary effort is normal. No respiratory distress.  Abdominal:     General: Abdomen is flat.  Musculoskeletal:        General: Normal range of motion.     Cervical back: Normal range of motion and neck supple. No rigidity or tenderness.     Comments: Swelling and TTP to the bridge of the nose  Skin:    General: Skin is warm and dry.  Neurological:     General: No focal deficit present.     Mental Status: He is alert and oriented to person, place, and time.  Psychiatric:        Mood and Affect: Mood normal.        Behavior: Behavior normal.     ED Results / Procedures / Treatments  Labs (all labs ordered are listed, but only abnormal results are displayed) Labs Reviewed - No data to display  EKG None  Radiology CT Maxillofacial Wo Contrast  Result Date: 07/25/2022 CLINICAL DATA:  Nose deformity EXAM: CT MAXILLOFACIAL WITHOUT CONTRAST TECHNIQUE: Multidetector CT imaging of the maxillofacial structures was performed. Multiplanar CT image reconstructions were also generated. RADIATION DOSE REDUCTION: This exam was performed according to the departmental dose-optimization program which includes automated exposure control, adjustment of the mA and/or kV according to patient size and/or use of iterative reconstruction technique. COMPARISON:  None Available. FINDINGS: Osseous: Mastoid air cells are clear. Mandibular heads are normally position. No mandibular fracture. Pterygoid plates and zygomatic arches are intact. Acute mildly displaced bilateral nasal bone fracture. Mild bowing of nasal septum to the right. Orbits: Negative. No traumatic  or inflammatory finding. Sinuses: No sinus wall fracture or fluid level Soft tissues: Swelling over the low forehead and nasal area. Limited intracranial: No significant or unexpected finding. IMPRESSION: Acute mildly displaced bilateral nasal bone fracture. Electronically Signed   By: Donavan Foil M.D.   On: 07/25/2022 19:57    Procedures Procedures    Medications Ordered in ED Medications  ketorolac (TORADOL) 30 MG/ML injection 30 mg (has no administration in time range)    ED Course/ Medical Decision Making/ A&P                             Medical Decision Making Amount and/or Complexity of Data Reviewed Radiology: ordered.  This patient presents to the ED for concern of facial trauma, this involves an extensive number of treatment options. The differential diagnosis includes fracture, contusion  My initial workup includes pain control, CT maxillofacial  Additional history obtained from: Nursing notes from this visit.  I ordered imaging studies including CT maxillofacial I independently visualized and interpreted imaging which showed minimally displaced bilateral nasal bone fractures I agree with the radiologist interpretation  Afebrile, hemodynamically stable.  22 year old male presenting to the ED for evaluation of nasal trauma.  On evaluation there is swelling and a moderate deformity to the nose.  He is able to breathe through the nose. Septum is midline.  There is no active bleeding.  There are no overlying lesions.  CT maxillofacial revealed minimally displaced bilateral nasal bone fractures.  Canadian head CT rule negative.  Patient was given contact information for ENT and encouraged to follow-up.  He was given information regarding appropriate dosing of Tylenol and ibuprofen as well as management of concussion symptoms at home.  He was given return precautions.  Stable at discharge.  At this time there does not appear to be any evidence of an acute emergency medical  condition and the patient appears stable for discharge with appropriate outpatient follow up. Diagnosis was discussed with patient who verbalizes understanding of care plan and is agreeable to discharge. I have discussed return precautions with patient and mother who verbalizes understanding. Patient encouraged to follow-up with ENT within 1 week. All questions answered.  Note: Portions of this report may have been transcribed using voice recognition software. Every effort was made to ensure accuracy; however, inadvertent computerized transcription errors may still be present.        Final Clinical Impression(s) / ED Diagnoses Final diagnoses:  Closed fracture of nasal bone, initial encounter    Rx / DC Orders ED Discharge Orders     None         Analeigha Nauman, [place], PA-C  07/25/22 2140    Audley Hose, MD 07/26/22 [phone]

## 2022-07-25 NOTE — ED Triage Notes (Signed)
Pt playing baseball when line-drive hit to him, ball went off of glove and hit patient in the nose. (+) deformity to nose. (-) LOC. Denies dizziness/balance issues. Reports headache 7/10

## 2022-07-25 NOTE — ED Notes (Signed)
Discharge paperwork reviewed entirely with patient, including Rx's and follow up care. Pain was under control. Pt verbalized understanding as well as all parties involved. No questions or concerns voiced at the time of discharge. No acute distress noted.   Pt ambulated out to PVA without incident or assistance.  

## 2022-07-25 NOTE — Discharge Instructions (Signed)
You have been seen today for your complaint of facial trauma. Your imaging showed a minimally displaced nasal bone fracture. Your discharge medications include Alternate tylenol and ibuprofen for pain. You may alternate these every 4 hours. You may take up to 800 mg of ibuprofen at a time and up to 1000 mg of tylenol. Home care instructions are as follows:  Continue applying ice for 15 minutes at a time multiple times throughout the day Follow up with: Dr. Benjamine Mola, he is an ENT doctor.  Call tomorrow to schedule an appointment for follow-up visit Please seek immediate medical care if you develop any of the following symptoms: You have bleeding from your nose that does not stop after you pinch your nostrils closed for 20 minutes and keep ice on your nose. You have clear fluid draining out of your nose. You notice swelling near the septum inside the nose. This swelling is a septal hematoma that must be drained to help prevent infection. You have difficulty moving your eyes. You have repeated vomiting. At this time there does not appear to be the presence of an emergent medical condition, however there is always the potential for conditions to change. Please read and follow the below instructions.  Do not take your medicine if  develop an itchy rash, swelling in your mouth or lips, or difficulty breathing; call 911 and seek immediate emergency medical attention if this occurs.  You may review your lab tests and imaging results in their entirety on your MyChart account.  Please discuss all results of fully with your primary care provider and other specialist at your follow-up visit.  Note: Portions of this text may have been transcribed using voice recognition software. Every effort was made to ensure accuracy; however, inadvertent computerized transcription errors may still be present.

## 2023-07-02 ENCOUNTER — Emergency Department (HOSPITAL_COMMUNITY)
Admission: EM | Admit: 2023-07-02 | Discharge: 2023-07-02 | Disposition: A | Discharge status: Home / Self Care | Attending: Emergency Medicine | Admitting: Emergency Medicine

## 2023-07-02 ENCOUNTER — Emergency Department (HOSPITAL_COMMUNITY)
Admission: EM | Admit: 2023-07-02 | Discharge: 2023-07-02 | Discharge status: Absent without leave | Source: Home / Self Care

## 2023-07-02 ENCOUNTER — Emergency Department (HOSPITAL_COMMUNITY)

## 2023-07-02 ENCOUNTER — Encounter (HOSPITAL_COMMUNITY): Payer: Self-pay | Admitting: Emergency Medicine

## 2023-07-02 ENCOUNTER — Other Ambulatory Visit: Payer: Self-pay

## 2023-07-02 DIAGNOSIS — Y9364 Activity, baseball: Secondary | ICD-10-CM | POA: Insufficient documentation

## 2023-07-02 DIAGNOSIS — S43101A Unspecified dislocation of right acromioclavicular joint, initial encounter: Secondary | ICD-10-CM

## 2023-07-02 DIAGNOSIS — S4991XA Unspecified injury of right shoulder and upper arm, initial encounter: Secondary | ICD-10-CM | POA: Diagnosis present

## 2023-07-02 DIAGNOSIS — W1830XA Fall on same level, unspecified, initial encounter: Secondary | ICD-10-CM | POA: Insufficient documentation

## 2023-07-02 DIAGNOSIS — S43121A Dislocation of right acromioclavicular joint, 100%-200% displacement, initial encounter: Secondary | ICD-10-CM | POA: Diagnosis not present

## 2023-07-02 MED ORDER — ACETAMINOPHEN 500 MG PO TABS
1000.0000 mg | ORAL_TABLET | Freq: Once | ORAL | Status: AC
  Administered 2023-07-02: 1000 mg via ORAL
  Filled 2023-07-02: qty 2

## 2023-07-02 MED ORDER — OXYCODONE HCL 5 MG PO TABS
5.0000 mg | ORAL_TABLET | Freq: Once | ORAL | Status: AC
  Administered 2023-07-02: 5 mg via ORAL
  Filled 2023-07-02: qty 1

## 2023-07-02 MED ORDER — MORPHINE SULFATE 15 MG PO TABS: 7.5000 mg | ORAL_TABLET | ORAL | 0 refills | Status: AC | PRN

## 2023-07-02 NOTE — ED Triage Notes (Signed)
 Pt took hit to shoulder and then fell over on it during baseball game. Pt mother states pt passed out in car on way here.

## 2023-07-02 NOTE — Discharge Instructions (Signed)
 Call your orthopedist or the orthopedist listed in this paperwork tomorrow morning to set up an appointment as soon she can see them in the office.  Typically they will see you within a week.  You do need to move your shoulder around at least 4 times a day.  Take 4 over the counter ibuprofen tablets 3 times a day or 2 over-the-counter naproxen tablets twice a day for pain. Also take tylenol 1000mg (2 extra strength) four times a day.   Then take the pain medicine if you feel like you need it. Narcotics do not help with the pain, they only make you care about it less.  You can become addicted to this, people may break into your house to steal it.  It will constipate you.  If you drive under the influence of this medicine you can get a DUI.

## 2023-07-02 NOTE — ED Provider Notes (Signed)
 [place] EMERGENCY DEPARTMENT AT [facility] Provider Note   CSN: [id number] Arrival date & time: 07/02/23  1644     History  Chief Complaint  Patient presents with   Shoulder Injury    Cody Wright is a 23 y.o. male.  23 yo M with a cc of  right shoulder pain.  Patient was playing baseball he was chasing after someone and got likely tangled and he fell and landed on left shoulder.  He denies head injury denies loss of consciousness denies headache neck pain.  Pain mostly to the anterior aspect of the right shoulder and radiates down the arm.  He was in the car on the way here and actually passed out a couple times.  Now feels better.  Denies chest pain or difficulty breathing.   Shoulder Injury       Home Medications Prior to Admission medications   Medication Sig Start Date End Date Taking? Authorizing Provider  morphine (MSIR) 15 MG tablet Take 0.5 tablets (7.5 mg total) by mouth every 4 (four) hours as needed for severe pain (pain score 7-10). 07/02/23  Yes Melene Plan, DO  tiZANidine (ZANAFLEX) 4 MG tablet Take 1 tablet (4 mg total) by mouth every 8 (eight) hours as needed for muscle spasms. 05/26/20   Jiles Harold, PA-C      Allergies    Mineral oil    Review of Systems   Review of Systems  Physical Exam Updated Vital Signs BP (!) 155/99 (BP Location: Left Arm)   Pulse 60   Temp 98.3 F (36.8 C) (Oral)   Resp 14   Ht 5\' 10"  (1.778 m)   Wt 95.3 kg   SpO2 100%   BMI 30.13 kg/m  Physical Exam Vitals and nursing note reviewed.  Constitutional:      Appearance: He is well-developed.  HENT:     Head: Normocephalic and atraumatic.  Eyes:     Pupils: Pupils are equal, round, and reactive to light.  Neck:     Vascular: No JVD.  Cardiovascular:     Rate and Rhythm: Normal rate and regular rhythm.     Heart sounds: No murmur heard.    No friction rub. No gallop.  Pulmonary:     Effort: No respiratory distress.     Breath sounds: No  wheezing.  Abdominal:     General: There is no distension.     Tenderness: There is no abdominal tenderness. There is no guarding or rebound.  Musculoskeletal:        General: Swelling and tenderness present. Normal range of motion.     Cervical back: Normal range of motion and neck supple.     Comments: Pain swelling and deformity along the midshaft of the clavicle.  Pulse motor and sensation intact distally.  Skin:    Coloration: Skin is not pale.     Findings: No rash.  Neurological:     Mental Status: He is alert and oriented to person, place, and time.  Psychiatric:        Behavior: Behavior normal.     ED Results / Procedures / Treatments   Labs (all labs ordered are listed, but only abnormal results are displayed) Labs Reviewed - No data to display  EKG EKG Interpretation Date/Time:  Sunday July 02 2023 18:12:40 EDT Ventricular Rate:  56 PR Interval:  143 QRS Duration:  99 QT Interval:  418 QTC Calculation: 404 R Axis:   73  Text Interpretation: Sinus  rhythm Probable left atrial enlargement RSR' in V1 or V2, right VCD or RVH no wpw, prolonged qt or brugada No old tracing to compare Confirmed by Melene Plan [phone]) on 07/02/2023 6:21:58 PM  Radiology DG Shoulder Right Result Date: 07/02/2023 CLINICAL DATA:  Shoulder pain after injury. EXAM: RIGHT SHOULDER - 2+ VIEW COMPARISON:  None available. FINDINGS: No acute fracture. No glenohumeral dislocation. Widening of the acromioclavicular joint at 15 mm. The clavicle is elevated 1 shaft with about the acromion. The coracoclavicular space appears widened at 2.6 cm. Mild soft tissue edema seen superiorly. IMPRESSION: 1. Acromioclavicular joint separation, likely grade 3. 2. No acute fracture or glenohumeral dislocation. Electronically Signed   By: Narda Rutherford M.D.   On: 07/02/2023 18:10    Procedures Procedures    Medications Ordered in ED Medications  acetaminophen (TYLENOL) tablet 1,000 mg (1,000 mg Oral Given 07/02/23  1725)  oxyCODONE (Oxy IR/ROXICODONE) immediate release tablet 5 mg (5 mg Oral Given 07/02/23 1725)    ED Course/ Medical Decision Making/ A&P                                 Medical Decision Making Amount and/or Complexity of Data Reviewed Radiology: ordered. ECG/medicine tests: ordered.  Risk OTC drugs. Prescription drug management.   23 yo M with a chief complaints of right shoulder pain.  Patient had fallen while playing baseball and landed on the right shoulder.  I think clinically the patient has a right clavicle fracture.  Will obtain a plain film.  Patient also passed out on the way here.  I think likely vasovagal by history.  Will obtain EKG.  EKG without concerning finding.  Plain film of the right shoulder independently interpreted by me with a AC joint separation  Sling for comfort.  Orthopedic follow-up.  6:22 PM:  I have discussed the diagnosis/risks/treatment options with the patient and family.  Evaluation and diagnostic testing in the emergency department does not suggest an emergent condition requiring admission or immediate intervention beyond what has been performed at this time.  They will follow up with PCP, ortho. We also discussed returning to the ED immediately if new or worsening sx occur. We discussed the sx which are most concerning (e.g., sudden worsening pain, fever, inability to tolerate by mouth) that necessitate immediate return. Medications administered to the patient during their visit and any new prescriptions provided to the patient are listed below.  Medications given during this visit Medications  acetaminophen (TYLENOL) tablet 1,000 mg (1,000 mg Oral Given 07/02/23 1725)  oxyCODONE (Oxy IR/ROXICODONE) immediate release tablet 5 mg (5 mg Oral Given 07/02/23 1725)     The patient appears reasonably screen and/or stabilized for discharge and I doubt any other medical condition or other [facility] requiring further screening, evaluation, or treatment in the  ED at this time prior to discharge.          Final Clinical Impression(s) / ED Diagnoses Final diagnoses:  Acromioclavicular joint separation, right, initial encounter    Rx / DC Orders ED Discharge Orders          Ordered    morphine (MSIR) 15 MG tablet  Every 4 hours PRN        07/02/23 1805              Melene Plan, DO 07/02/23 Rickey Primus

## 2023-12-24 ENCOUNTER — Emergency Department (HOSPITAL_COMMUNITY)
Admission: EM | Admit: 2023-12-24 | Discharge: 2023-12-25 | Disposition: A | Discharge status: Home / Self Care | Attending: Emergency Medicine | Admitting: Emergency Medicine

## 2023-12-24 ENCOUNTER — Encounter (HOSPITAL_COMMUNITY): Payer: Self-pay | Admitting: Emergency Medicine

## 2023-12-24 ENCOUNTER — Other Ambulatory Visit: Payer: Self-pay

## 2023-12-24 DIAGNOSIS — Y9364 Activity, baseball: Secondary | ICD-10-CM | POA: Diagnosis not present

## 2023-12-24 DIAGNOSIS — J45909 Unspecified asthma, uncomplicated: Secondary | ICD-10-CM | POA: Diagnosis not present

## 2023-12-24 DIAGNOSIS — W1849XA Other slipping, tripping and stumbling without falling, initial encounter: Secondary | ICD-10-CM | POA: Insufficient documentation

## 2023-12-24 DIAGNOSIS — M545 Low back pain, unspecified: Secondary | ICD-10-CM | POA: Diagnosis present

## 2023-12-24 DIAGNOSIS — S39012A Strain of muscle, fascia and tendon of lower back, initial encounter: Secondary | ICD-10-CM | POA: Insufficient documentation

## 2023-12-24 MED ORDER — LIDOCAINE 5 % EX PTCH
1.0000 | MEDICATED_PATCH | CUTANEOUS | Status: DC
  Administered 2023-12-24: 1 via TRANSDERMAL
  Filled 2023-12-24: qty 1

## 2023-12-24 MED ORDER — CYCLOBENZAPRINE HCL 10 MG PO TABS
10.0000 mg | ORAL_TABLET | Freq: Once | ORAL | Status: AC
  Administered 2023-12-24: 10 mg via ORAL
  Filled 2023-12-24: qty 1

## 2023-12-24 MED ORDER — KETOROLAC TROMETHAMINE 15 MG/ML IJ SOLN
15.0000 mg | Freq: Once | INTRAMUSCULAR | Status: AC
  Administered 2023-12-24: 15 mg via INTRAMUSCULAR
  Filled 2023-12-24: qty 1

## 2023-12-24 MED ORDER — ACETAMINOPHEN 500 MG PO TABS
1000.0000 mg | ORAL_TABLET | Freq: Once | ORAL | Status: AC
  Administered 2023-12-24: 1000 mg via ORAL
  Filled 2023-12-24: qty 2

## 2023-12-24 NOTE — ED Triage Notes (Signed)
 Patient presents due to low back pain, mostly on the left side. He reports pain radiates up the back. He believes pain is due to sliding into a base at a baseball game tonight.

## 2023-12-24 NOTE — ED Provider Notes (Signed)
 [place] EMERGENCY DEPARTMENT AT [facility] Provider Note   CSN: [id number] Arrival date & time: 12/24/23  2031     Patient presents with: Back Pain   Cody Wright is a 23 y.o. male with history of asthma .  Patient presents to ED for evaluation of left low back pain.  States that he was in usual state of health, playing baseball earlier this evening when he slid into a base.  States that upon sliding to the space, he stood up and had very bad low back pain on the left side.  States his low back pain on the left radiates into his left buttock.  Denies history of low back pain.  Denies bowel bladder incontinence, numbness of his groin, distal weakness.  Reports he is able to ambulate without difficulty.  Denies medications prior to arrival.  Denies fevers, IV drug use, dysuria.   Back Pain      Prior to Admission medications   Medication Sig Start Date End Date Taking? Authorizing Provider  morphine  (MSIR) 15 MG tablet Take 0.5 tablets (7.5 mg total) by mouth every 4 (four) hours as needed for severe pain (pain score 7-10). 07/02/23   Emil Share, DO  tiZANidine  (ZANAFLEX ) 4 MG tablet Take 1 tablet (4 mg total) by mouth every 8 (eight) hours as needed for muscle spasms. 05/26/20   Laliberte, Danielle, PA-C    Allergies: Mineral oil    Review of Systems  Musculoskeletal:  Positive for back pain.  All other systems reviewed and are negative.   Updated Vital Signs BP 123/73 (BP Location: Left Arm)   Pulse 60   Temp 98.2 F (36.8 C) (Oral)   Resp 18   SpO2 100%   Physical Exam Vitals and nursing note reviewed.  Constitutional:      General: He is not in acute distress.    Appearance: He is well-developed.  HENT:     Head: Normocephalic and atraumatic.  Eyes:     Conjunctiva/sclera: Conjunctivae normal.  Cardiovascular:     Rate and Rhythm: Normal rate and regular rhythm.     Heart sounds: No murmur heard. Pulmonary:     Effort: Pulmonary effort is normal.  No respiratory distress.     Breath sounds: Normal breath sounds.  Abdominal:     Palpations: Abdomen is soft.     Tenderness: There is no abdominal tenderness.  Musculoskeletal:        General: No swelling.     Cervical back: Neck supple.       Back:     Comments: Tenderness to palpation of above circumscribed area.  No centralized lumbar spinal tenderness.  No overlying skin change.  No ecchymosis.  Skin:    General: Skin is warm and dry.     Capillary Refill: Capillary refill takes less than 2 seconds.  Neurological:     Mental Status: He is alert and oriented to person, place, and time. Mental status is at baseline.     Comments: 5 out of 5 strength bilateral lower extremities.  Neurological examination reassuring.  Psychiatric:        Mood and Affect: Mood normal.     (all labs ordered are listed, but only abnormal results are displayed) Labs Reviewed - No data to display  EKG: None  Radiology: No results found.  Procedures   Medications Ordered in the ED  ketorolac  (TORADOL ) 15 MG/ML injection 15 mg (has no administration in time range)  acetaminophen  (TYLENOL ) tablet  1,000 mg (has no administration in time range)  cyclobenzaprine  (FLEXERIL ) tablet 10 mg (has no administration in time range)  lidocaine  (LIDODERM ) 5 % 1 patch (has no administration in time range)    Medical Decision Making Risk OTC drugs. Prescription drug management.   ***    Final diagnoses:  None    ED Discharge Orders     None

## 2023-12-25 MED ORDER — CYCLOBENZAPRINE HCL 10 MG PO TABS: 10.0000 mg | ORAL_TABLET | Freq: Two times a day (BID) | ORAL | 0 refills | Status: AC | PRN

## 2023-12-25 MED ORDER — NAPROXEN 500 MG PO TABS: 500.0000 mg | ORAL_TABLET | Freq: Two times a day (BID) | ORAL | 0 refills | Status: AC

## 2023-12-25 NOTE — Discharge Instructions (Addendum)
 It was a pleasure taking part in your care.  As discussed, your work appears reassuring.  I feel as if you have most likely pulled back in your lumbar area.  Please begin taking naproxen  twice a day.  Please begin taking Flexeril  twice a day as needed for pain and low back.  Do not mix with alcohol, do not drive or operate machinery while taking Flexeril .  Follow-up with your PCP.  Return to the ED with any new symptoms.  Read attached guide concerning lumbar strain.
